# Patient Record
Sex: Male | Born: 1950 | Race: White | Hispanic: No | Marital: Married | Smoking: Current every day smoker
Health system: Southern US, Community
[De-identification: ages and names within clinical notes are randomized; demographics above are authoritative.]

## PROBLEM LIST (undated history)

## (undated) DIAGNOSIS — E785 Hyperlipidemia, unspecified: Secondary | ICD-10-CM

## (undated) DIAGNOSIS — F32A Depression, unspecified: Secondary | ICD-10-CM

## (undated) DIAGNOSIS — I10 Essential (primary) hypertension: Secondary | ICD-10-CM

## (undated) DIAGNOSIS — F329 Major depressive disorder, single episode, unspecified: Secondary | ICD-10-CM

## (undated) DIAGNOSIS — K219 Gastro-esophageal reflux disease without esophagitis: Secondary | ICD-10-CM

## (undated) HISTORY — DX: Major depressive disorder, single episode, unspecified: F32.9

## (undated) HISTORY — DX: Hyperlipidemia, unspecified: E78.5

## (undated) HISTORY — DX: Essential (primary) hypertension: I10

## (undated) HISTORY — DX: Gastro-esophageal reflux disease without esophagitis: K21.9

## (undated) HISTORY — DX: Depression, unspecified: F32.A

---
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
## 2005-04-21 ENCOUNTER — Ambulatory Visit: Payer: Self-pay | Admitting: Family Medicine

## 2006-06-27 IMAGING — CR DG FOOT COMPLETE 3+V*L*
1 series · 3 of 3 positions shown · non-contrast
Comparison: none

REASON FOR EXAM: xray lt foot pain
COMMENTS:

[Series 1: view not recorded · 0.17mm/px · 3 of 3 slices shown]
[im 1/3]
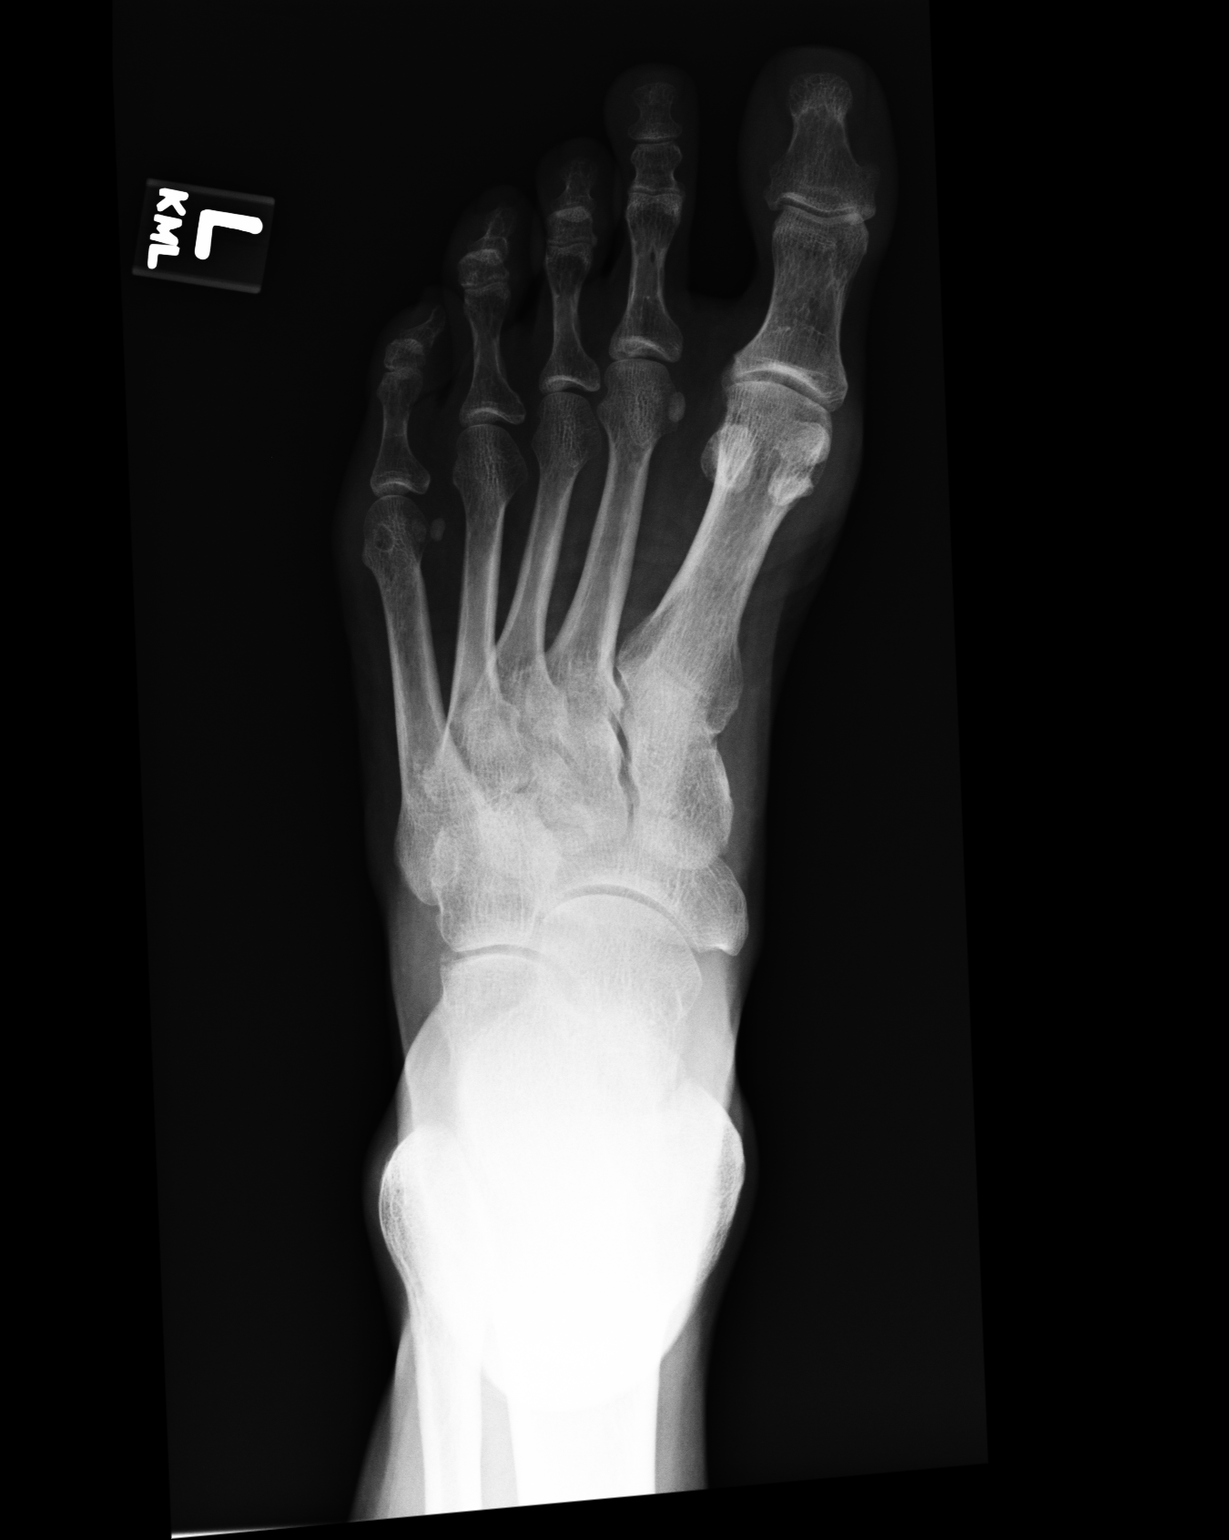
[im 2/3]
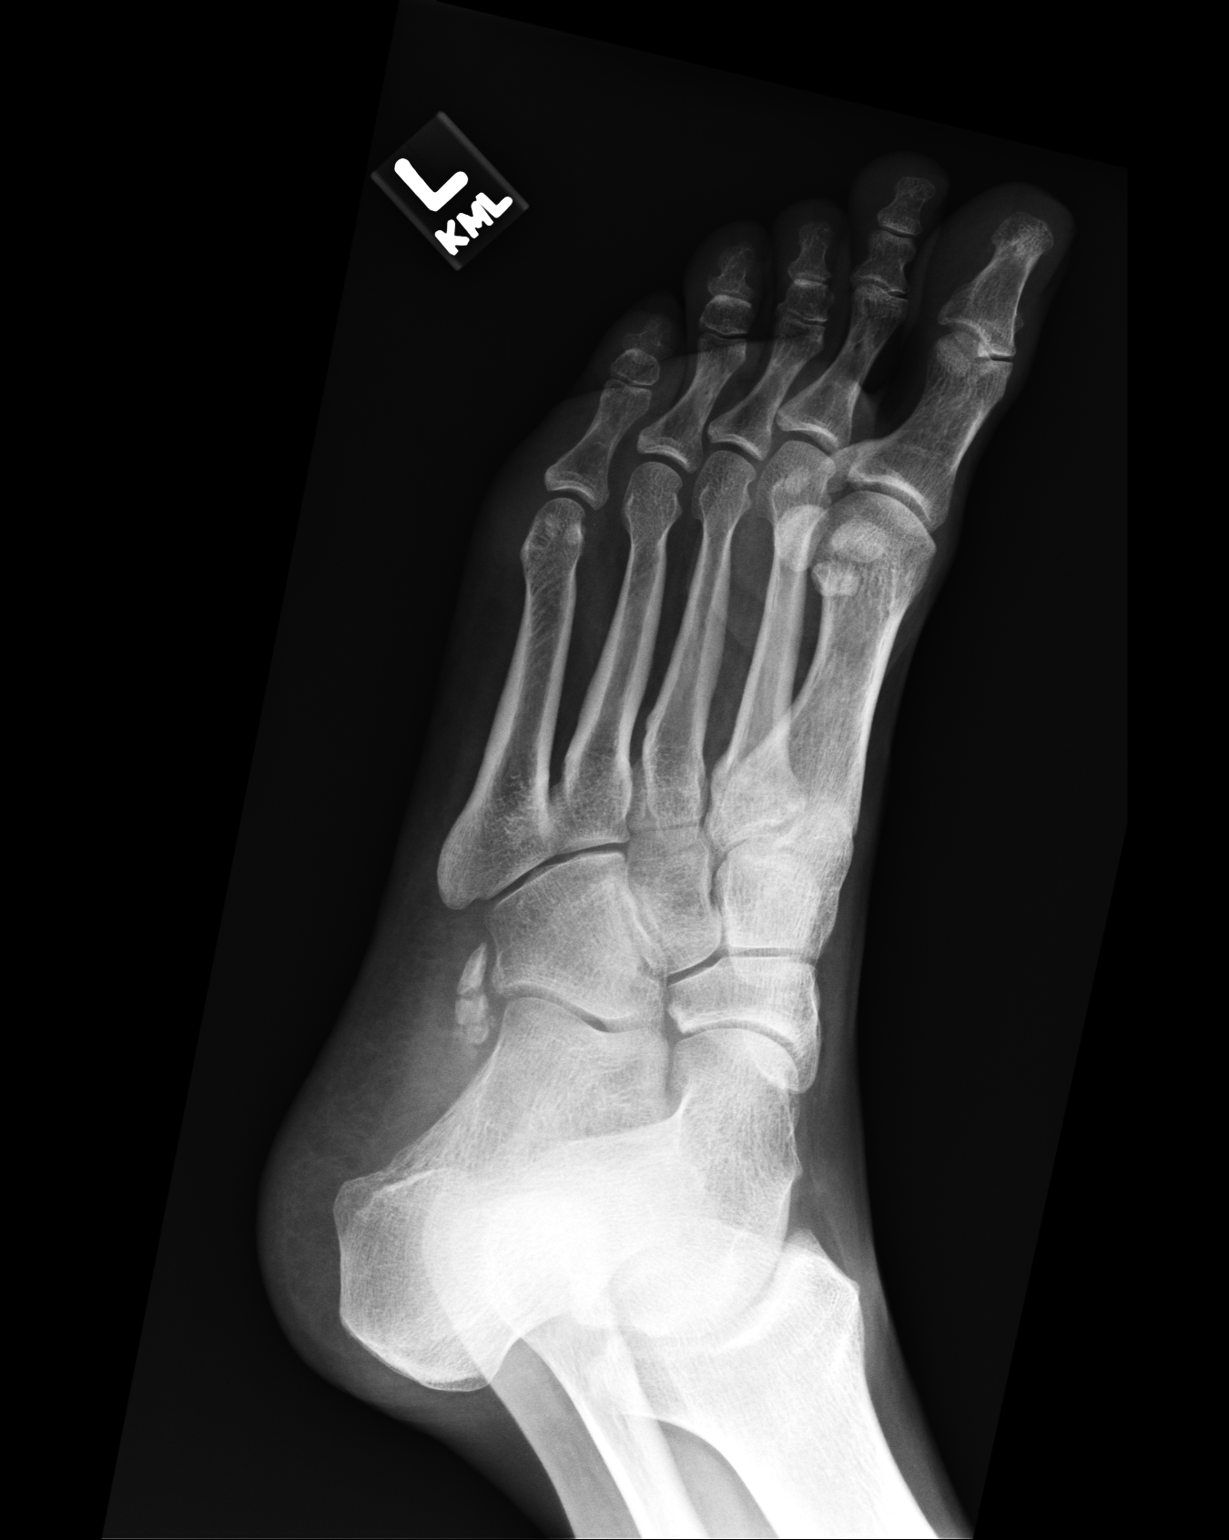
[im 3/3]
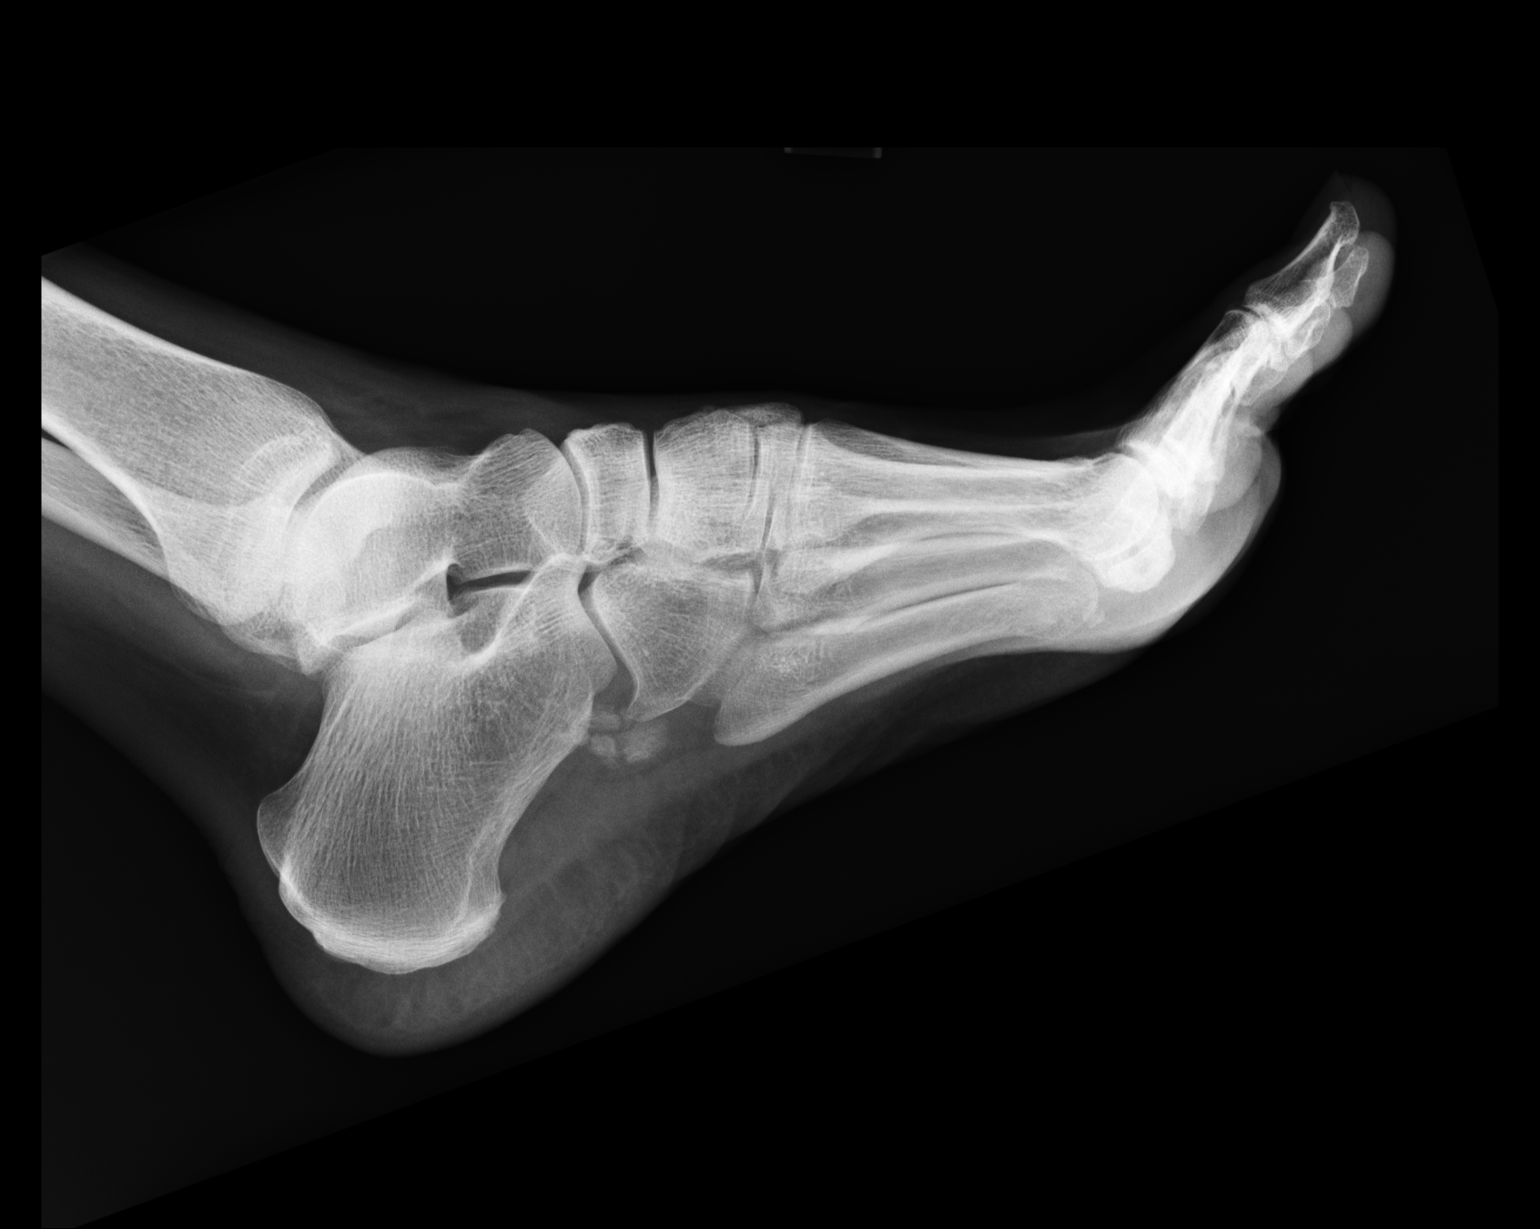

[3 of 3 positions shown; findings below may reference images not displayed]

PROCEDURE:     DXR - DXR FOOT LT COMP W/OBLIQUES  - April 21, 2005  [DATE]

RESULT:     Three views of the LEFT foot show no fracture, dislocation, or
other acute bony abnormality.  There are noted calcifications in the soft
tissues adjacent to the plantar aspect of the cuboid.  These apparently
represent sesamoid bones or possibly fragments of a sesamoid that has been
previously fractured.  No acute fracture is evident.  Incidental note is
made of a 3-4 mm cyst in the distal 5th metatarsal.
IMPRESSION: No acute changes are identified.

## 2013-10-21 LAB — HEPATIC FUNCTION PANEL
ALK PHOS: 83 U/L (ref 25–125)
ALT: 23 U/L (ref 10–40)
AST: 26 U/L (ref 14–40)
BILIRUBIN, TOTAL: 0.2 mg/dL

## 2013-10-21 LAB — CBC AND DIFFERENTIAL
HCT: 48 % (ref 41–53)
HEMOGLOBIN: 16.4 g/dL (ref 13.5–17.5)
Neutrophils Absolute: 6 /uL
PLATELETS: 306 10*3/uL (ref 150–399)
WBC: 8.7 10^3/mL

## 2013-10-21 LAB — BASIC METABOLIC PANEL
BUN: 19 mg/dL (ref 4–21)
CREATININE: 0.9 mg/dL (ref 0.6–1.3)
Glucose: 76 mg/dL
POTASSIUM: 4.2 mmol/L (ref 3.4–5.3)
SODIUM: 141 mmol/L (ref 137–147)

## 2013-10-21 LAB — LIPID PANEL
CHOLESTEROL: 205 mg/dL — AB (ref 0–200)
HDL: 54 mg/dL (ref 35–70)
LDL Cholesterol: 121 mg/dL
TRIGLYCERIDES: 150 mg/dL (ref 40–160)

## 2013-10-21 LAB — TSH: TSH: 0.69 u[IU]/mL (ref 0.41–5.90)

## 2013-10-21 LAB — PSA: PSA: 0.8

## 2014-04-21 ENCOUNTER — Ambulatory Visit: Payer: Self-pay | Admitting: Family Medicine

## 2015-05-18 ENCOUNTER — Ambulatory Visit (INDEPENDENT_AMBULATORY_CARE_PROVIDER_SITE_OTHER): Payer: PRIVATE HEALTH INSURANCE | Admitting: Family Medicine

## 2015-05-18 ENCOUNTER — Encounter: Payer: Self-pay | Admitting: Family Medicine

## 2015-05-18 VITALS — BP 128/70 | HR 84 | Temp 97.8°F | Resp 16 | Wt 132.0 lb

## 2015-05-18 DIAGNOSIS — R634 Abnormal weight loss: Secondary | ICD-10-CM | POA: Diagnosis not present

## 2015-05-18 DIAGNOSIS — E7800 Pure hypercholesterolemia, unspecified: Secondary | ICD-10-CM

## 2015-05-18 DIAGNOSIS — K219 Gastro-esophageal reflux disease without esophagitis: Secondary | ICD-10-CM | POA: Insufficient documentation

## 2015-05-18 DIAGNOSIS — G44029 Chronic cluster headache, not intractable: Secondary | ICD-10-CM | POA: Insufficient documentation

## 2015-05-18 DIAGNOSIS — G4700 Insomnia, unspecified: Secondary | ICD-10-CM | POA: Insufficient documentation

## 2015-05-18 DIAGNOSIS — F411 Generalized anxiety disorder: Secondary | ICD-10-CM | POA: Insufficient documentation

## 2015-05-18 DIAGNOSIS — F17200 Nicotine dependence, unspecified, uncomplicated: Secondary | ICD-10-CM | POA: Insufficient documentation

## 2015-05-18 DIAGNOSIS — E559 Vitamin D deficiency, unspecified: Secondary | ICD-10-CM | POA: Insufficient documentation

## 2015-05-18 DIAGNOSIS — J309 Allergic rhinitis, unspecified: Secondary | ICD-10-CM | POA: Insufficient documentation

## 2015-05-18 DIAGNOSIS — M5030 Other cervical disc degeneration, unspecified cervical region: Secondary | ICD-10-CM | POA: Insufficient documentation

## 2015-05-18 DIAGNOSIS — I10 Essential (primary) hypertension: Secondary | ICD-10-CM | POA: Diagnosis not present

## 2015-05-18 DIAGNOSIS — F330 Major depressive disorder, recurrent, mild: Secondary | ICD-10-CM | POA: Insufficient documentation

## 2015-05-18 DIAGNOSIS — M259 Joint disorder, unspecified: Secondary | ICD-10-CM | POA: Insufficient documentation

## 2015-05-18 DIAGNOSIS — G44009 Cluster headache syndrome, unspecified, not intractable: Secondary | ICD-10-CM | POA: Insufficient documentation

## 2015-05-18 MED ORDER — HYDROCODONE-ACETAMINOPHEN 5-325 MG PO TABS: 1.0000 | ORAL_TABLET | ORAL | Status: DC | PRN

## 2015-05-18 NOTE — Progress Notes (Signed)
Patient ID: Joseph Farrell, male   DOB: 03/12/1951, [age] y.o.   MRN: [id number]    Subjective:  HPI  Hypertension, follow-up:  BP Readings from Last 3 Encounters:  05/18/15 128/70    He was last seen for hypertension 5 months ago.  BP at that visit was 138/80. Management since that visit includes none. He reports good compliance with treatment. He is not having side effects. He is not exercising. He is not adherent to low salt diet.   Outside blood pressures are not being checked. Patient denies chest pain and chest pressure/discomfort.      Wt Readings from Last 3 Encounters:  05/18/15 132 lb (59.875 kg)    ------------------------------------------------------------------------   Pt reports that he has emotionally doing ok. He has some stressful times with his wife but he reports that he seems to be handling it well. He states " I just have to remind myself that there are people out there in much worse shape than I am in".     Prior to Admission medications   Medication Sig Start Date End Date Taking? Authorizing Provider  HYDROcodone-acetaminophen (NORCO/VICODIN) 5-325 MG tablet Take by mouth. 12/15/14  Yes Historical Provider, MD  omeprazole (PRILOSEC) 20 MG capsule Take 20 mg by mouth daily.   Yes Historical Provider, MD  tiZANidine (ZANAFLEX) 4 MG capsule Take by mouth. 12/15/14  Yes Historical Provider, MD  esomeprazole (NEXIUM) 40 MG capsule Take by mouth. 12/15/14   Historical Provider, MD    Patient Active Problem List   Diagnosis Date Noted  . Allergic rhinitis 05/18/2015  . Disorder of shoulder 05/18/2015  . Chronic cluster headache 05/18/2015  . Insomnia, persistent 05/18/2015  . Cluster headache syndrome 05/18/2015  . Degeneration of intervertebral disc of cervical region 05/18/2015  . Anxiety, generalized 05/18/2015  . Acid reflux 05/18/2015  . BP (high blood pressure) 05/18/2015  . Mild episode of recurrent major depressive disorder (HCC) 05/18/2015  .  Pure hypercholesterolemia 05/18/2015  . Compulsive tobacco user syndrome 05/18/2015  . Avitaminosis D 05/18/2015  . Decreased body weight 05/18/2015    Past Medical History  Diagnosis Date  . Hypertension   . Hyperlipidemia   . GERD (gastroesophageal reflux disease)   . Depression     Social History   Social History  . Marital Status: Married    Spouse Name: N/A  . Number of Children: N/A  . Years of Education: N/A   Occupational History  . Not on file.   Social History Main Topics  . Smoking status: Current Every Day Smoker  . Smokeless tobacco: Never Used  . Alcohol Use: No  . Drug Use: No  . Sexual Activity: Not on file   Other Topics Concern  . Not on file   Social History Narrative    Not on File  Review of Systems  Constitutional: Negative.   HENT: Negative.   Eyes: Negative.   Respiratory: Negative.   Cardiovascular: Negative.   Gastrointestinal: Negative.   Genitourinary: Negative.   Musculoskeletal: Negative.   Skin: Negative.   Neurological: Negative.   Endo/Heme/Allergies: Negative.   Psychiatric/Behavioral: Positive for depression. The patient is nervous/anxious.     Immunization History  Administered Date(s) Administered  . Zoster 04/09/2012   Objective:  BP 128/70 mmHg  Pulse 84  Temp(Src) 97.8 F (36.6 C) (Oral)  Resp 16  Wt 132 lb (59.875 kg)  Physical Exam  Constitutional: He is oriented to person, place, and time and well-developed, well-nourished, and in no distress.  Cachectic appearing white male in no acute distress.  HENT:  Head: Normocephalic and atraumatic.  Right Ear: External ear normal.  Left Ear: External ear normal.  Nose: Nose normal.  Poor dentition  Eyes: Conjunctivae are normal.  Neck: Neck supple.  Cardiovascular: Normal rate, regular rhythm and normal heart sounds.   Pulmonary/Chest: Effort normal and breath sounds normal.  Abdominal: Soft.  Neurological: He is alert and oriented to person, place, and  time.  Skin: Skin is warm and dry.  Psychiatric: Mood, memory, affect and judgment normal.    Lab Results  Component Value Date   WBC 8.7 10/21/2013   HGB 16.4 10/21/2013   HCT 48 10/21/2013   PLT 306 10/21/2013   CHOL 205* 10/21/2013   TRIG 150 10/21/2013   HDL 54 10/21/2013   LDLCALC 121 10/21/2013   TSH 0.69 10/21/2013   PSA 0.8 10/21/2013    CMP     Component Value Date/Time   NA 141 10/21/2013   K 4.2 10/21/2013   BUN 19 10/21/2013   CREATININE 0.9 10/21/2013   AST 26 10/21/2013   ALT 23 10/21/2013   ALKPHOS 83 10/21/2013    Assessment and Plan :  1. Essential hypertension - CBC with Differential/Platelet - TSH - Comprehensive metabolic panel  2. Pure hypercholesterolemia  - Comprehensive metabolic panel  3. Degeneration of intervertebral disc of cervical region Refill meds times 3. - HYDROcodone-acetaminophen (NORCO/VICODIN) 5-325 MG tablet; Take 1-2 tablets by mouth every 4 (four) hours as needed for moderate pain. 07/18/15  Dispense: 150 tablet; Refill: 0 - Comprehensive metabolic panel  4. Weight loss  - CBC with Differential/Platelet - TSH - Comprehensive metabolic panel 5.Malnutrition due to low caloric intake. Pt declines further w/u--says he feels fine. 6.MDD Continuing issue.  I have done the exam and reviewed the above chart and it is accurate to the best of my knowledge.  Julieanne Manson MD [facility]med  [facility] 05/18/2015 11:54 AM

## 2015-06-27 IMAGING — CR CERVICAL SPINE - COMPLETE 4+ VIEW
1 series · 6 of 6 positions shown · non-contrast
Comparison: None.

CLINICAL DATA: Neck pain, right shoulder pain

EXAM:
CERVICAL SPINE  4+ VIEWS

[Series 1: kdxr c-spine complete · 0.14mm/px · 6 of 6 slices shown]
[im 1/6]
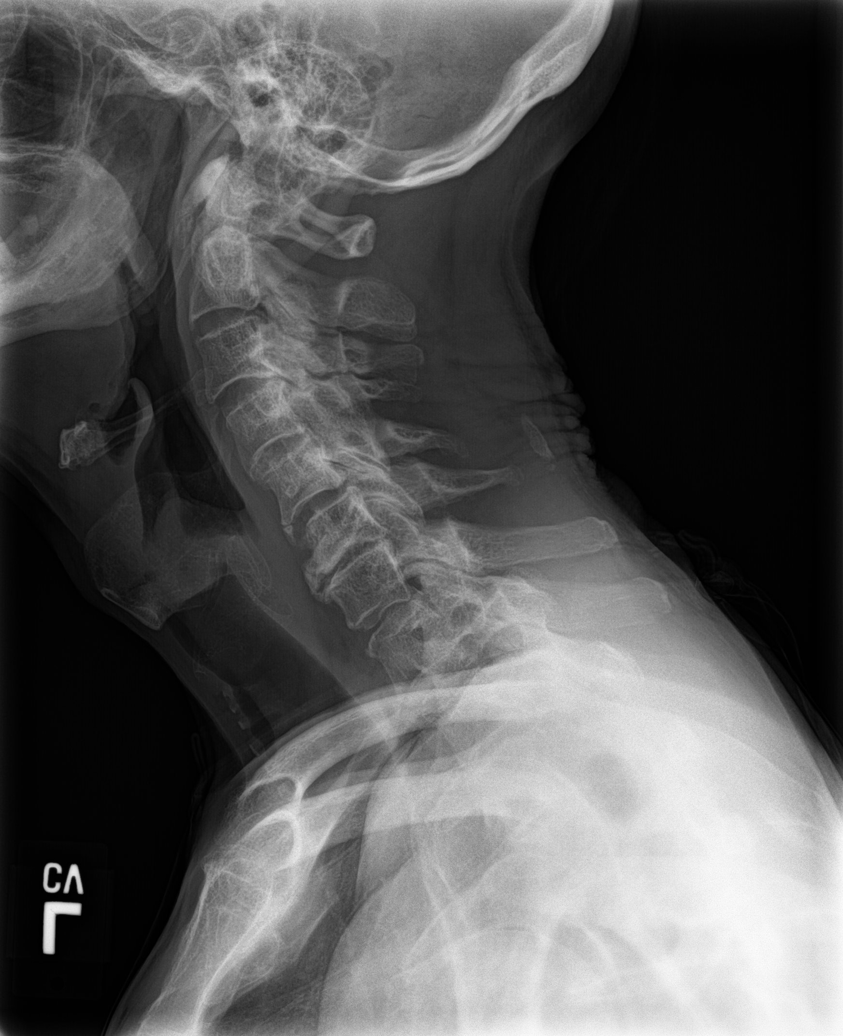
[im 2/6]
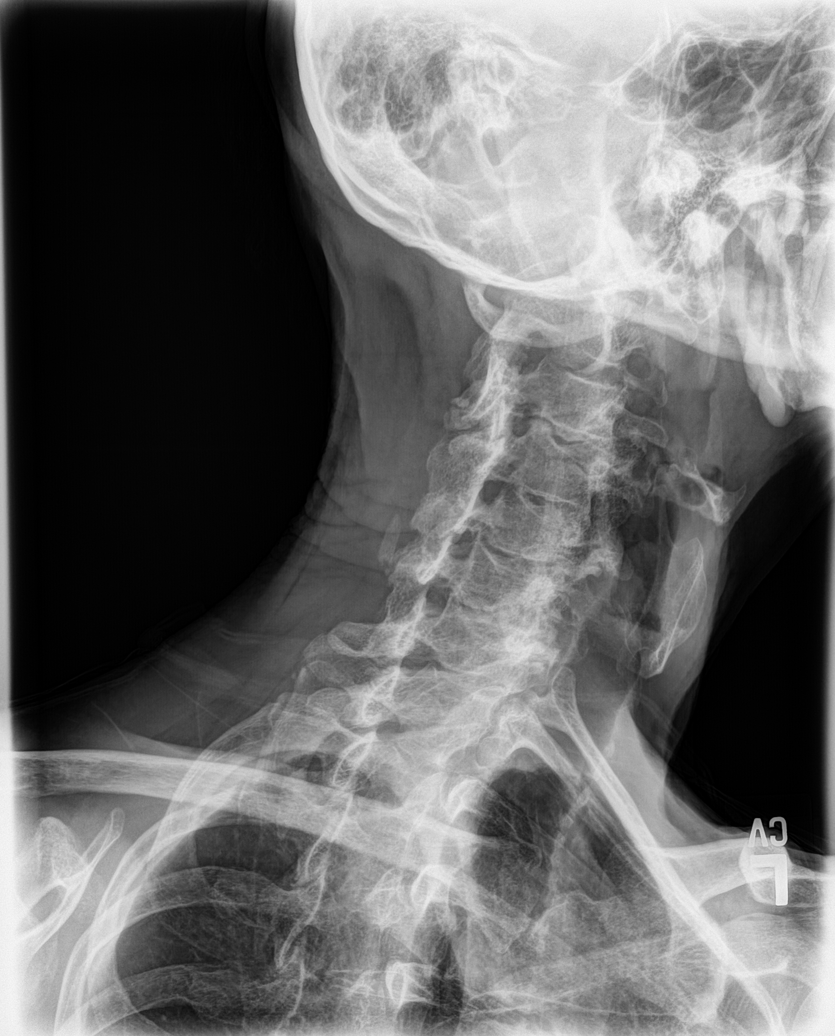
[im 3/6]
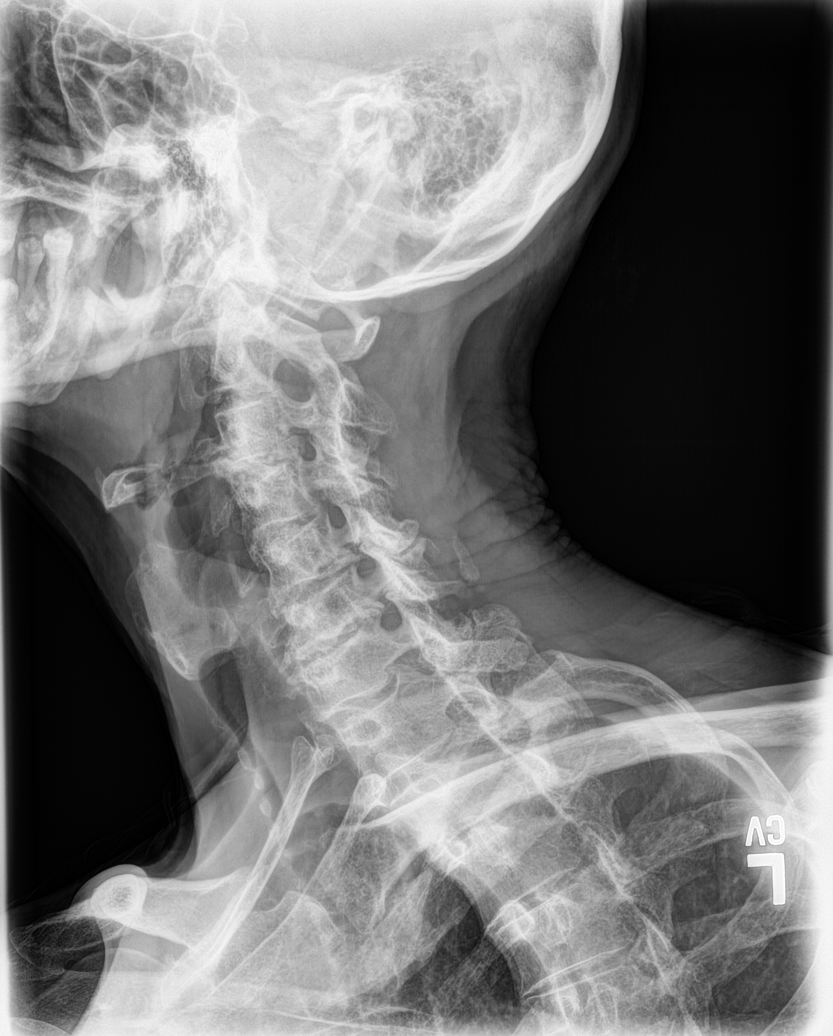
[im 4/6]
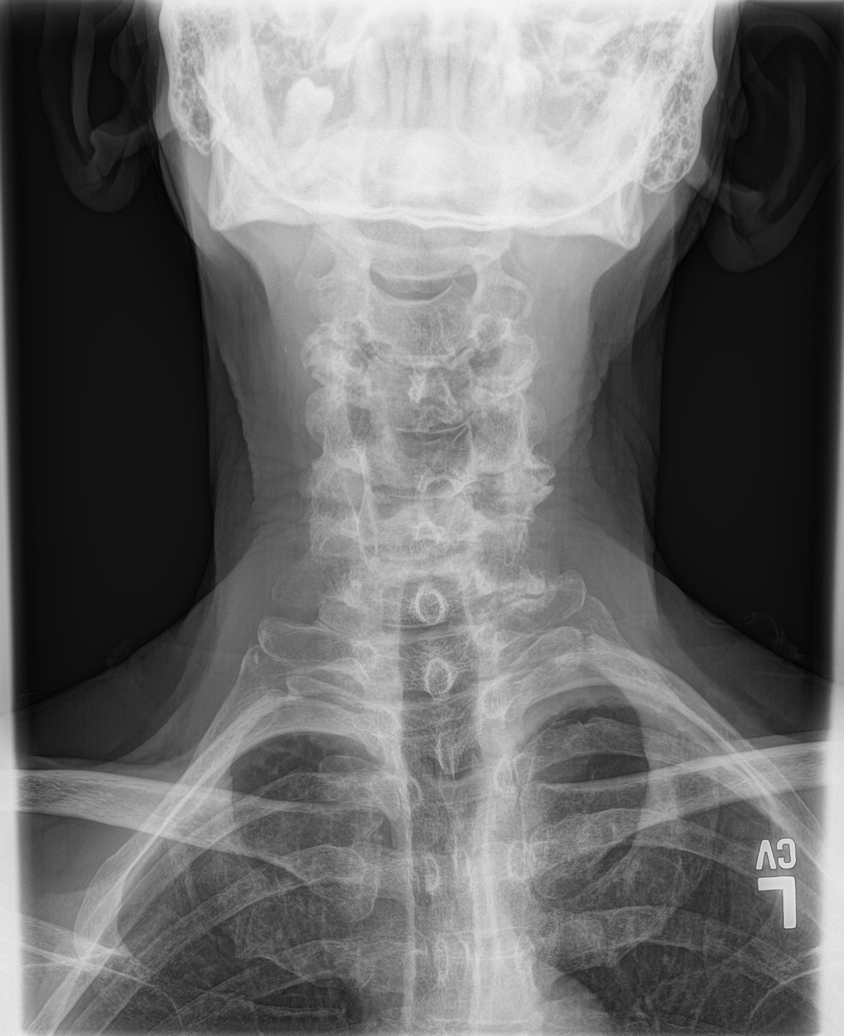
[im 5/6]
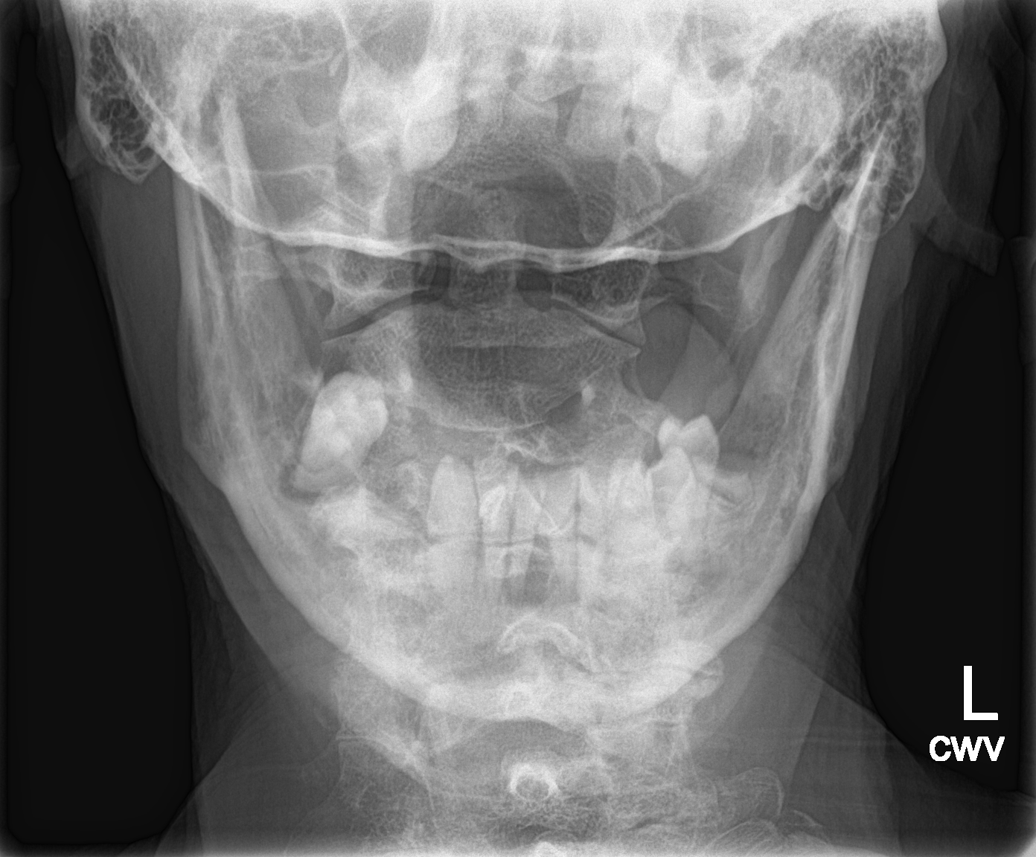
[im 6/6]
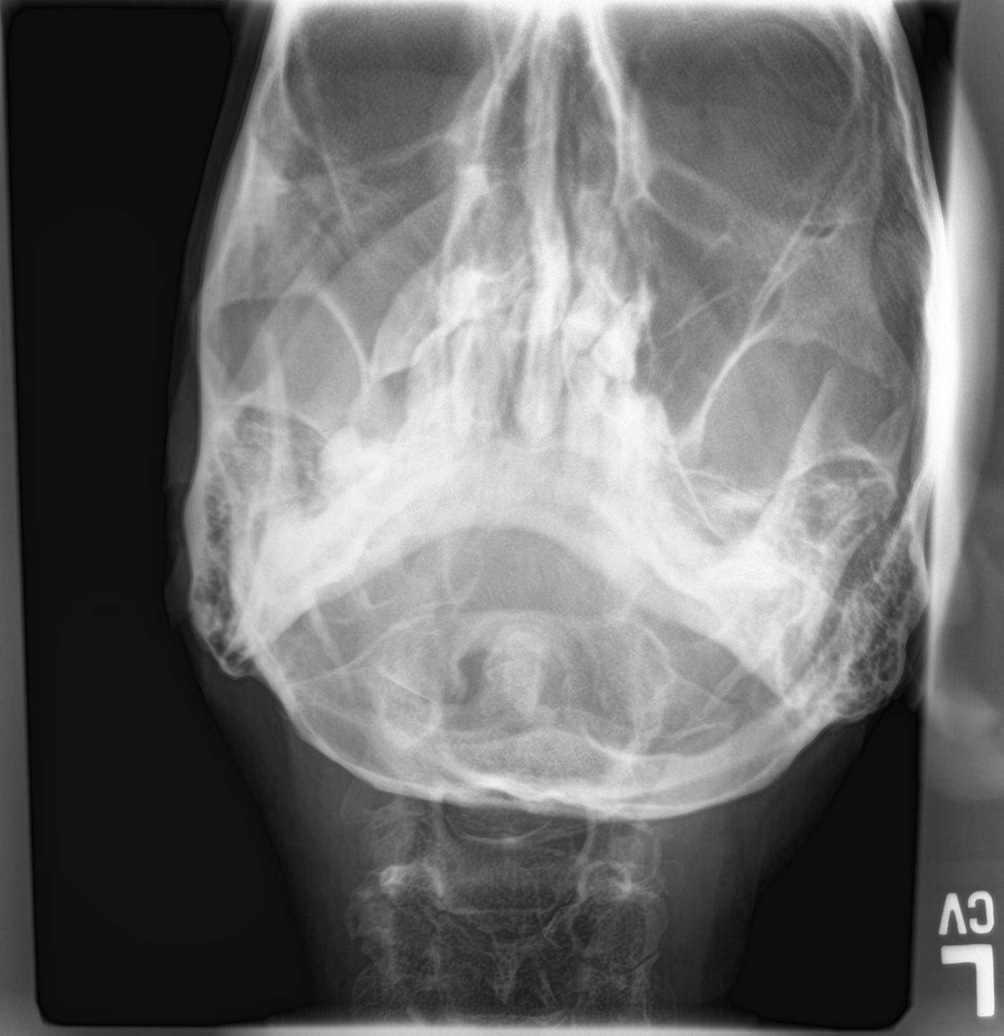

[6 of 6 positions shown; findings below may reference images not displayed]

FINDINGS: Six views of cervical spine submitted. No acute fracture or
subluxation. Mild disc space flattening with anterior spurring at
C4-C5 and C5-C6 level. Moderate disc space flattening with anterior
spurring and minimal posterior spurring at C6-C7 level. Mild disc
space flattening at C7-T1 level. No prevertebral soft tissue
swelling. Cervical airway is patent. Mild degenerative changes C1-C2
articulation.
IMPRESSION: No acute fracture or subluxation. Degenerative changes as described
above

## 2015-06-27 IMAGING — CR DG SHOULDER 3+V*R*
2 series · 3 of 3 positions shown · non-contrast
Comparison: None.

CLINICAL DATA: Neck pain, right shoulder pain, no known injury

EXAM:
DG SHOULDER 3+ VIEWS RIGHT

[Series 1: kdxr shoulder right complete · 0.14mm/px · 2 of 2 slices shown (1 of 2)]
[im 1/2]
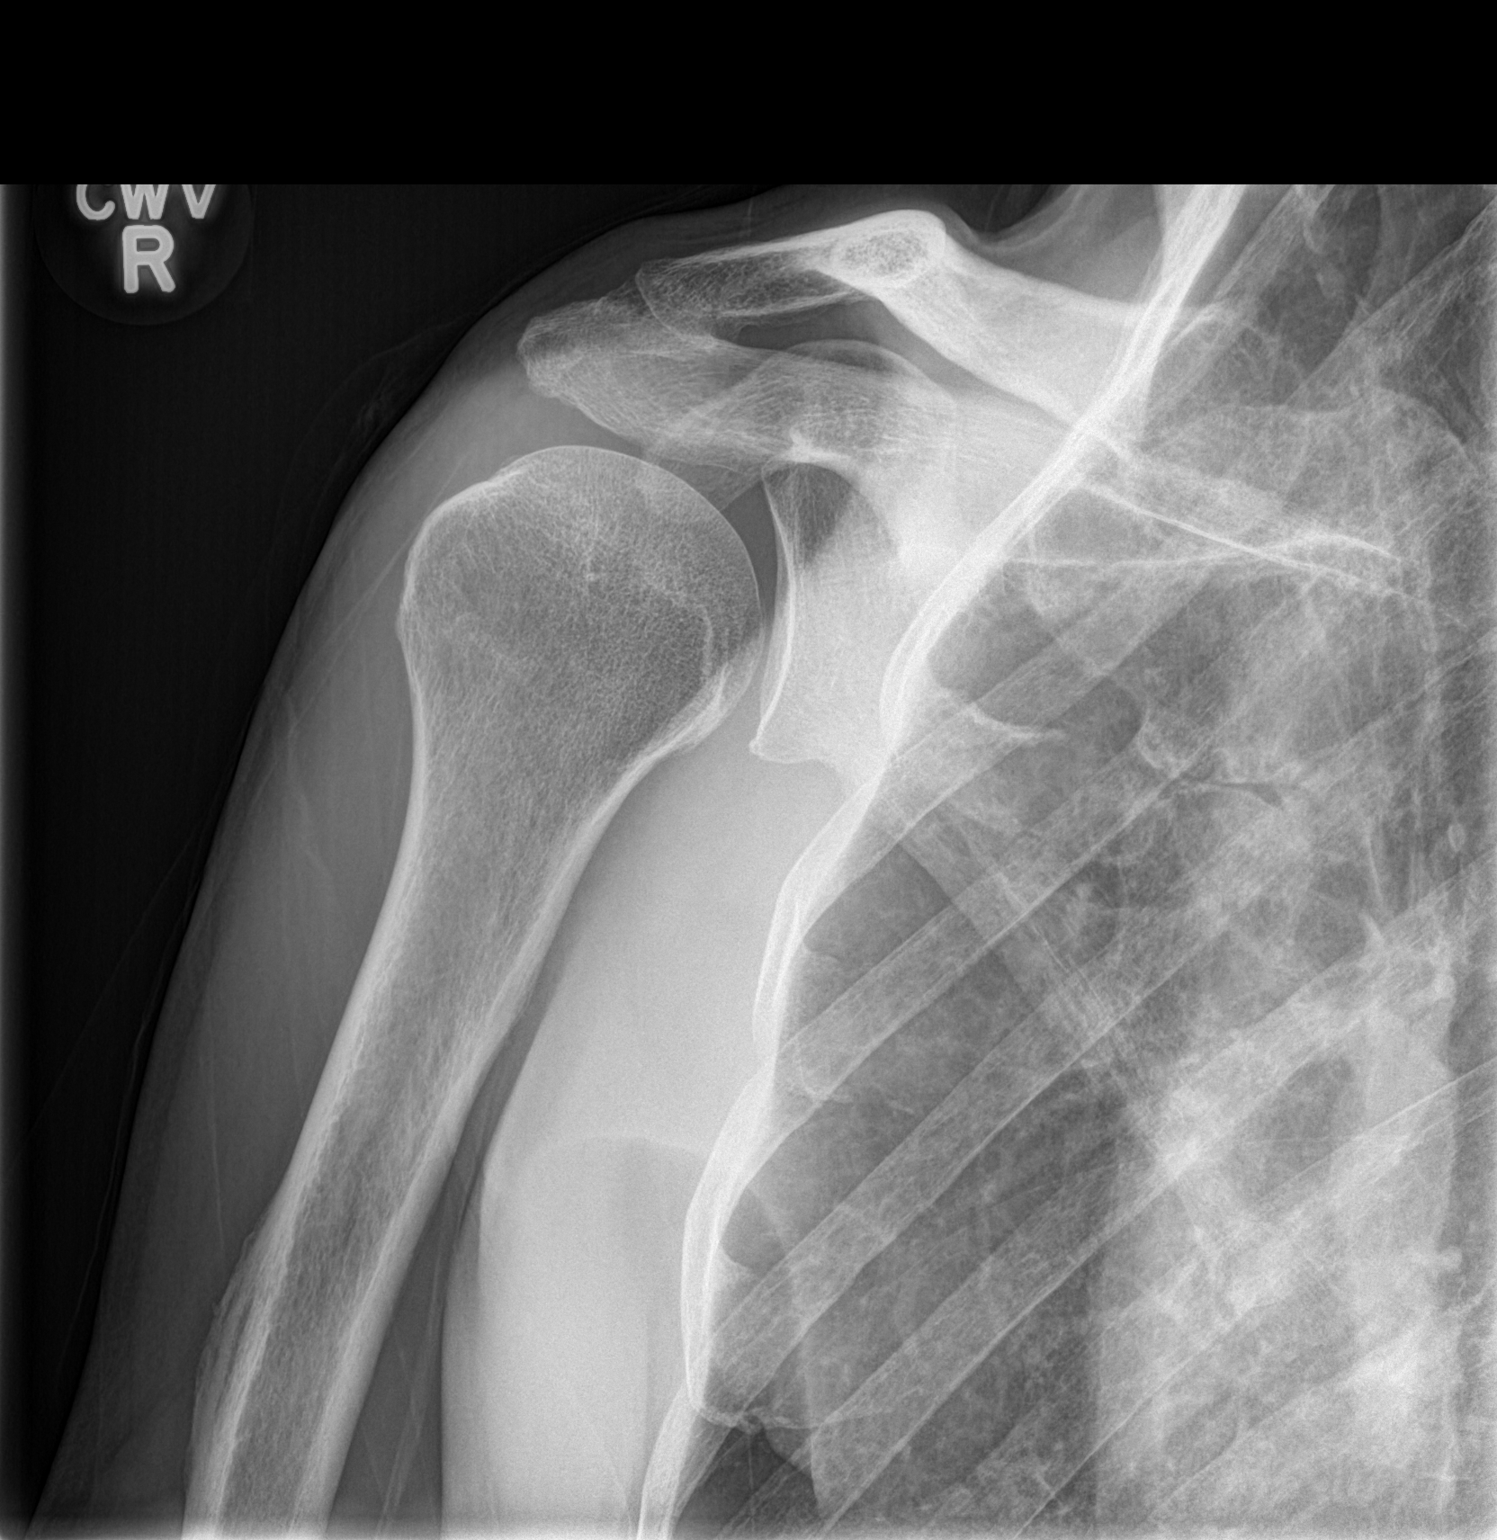
[im 2/2]
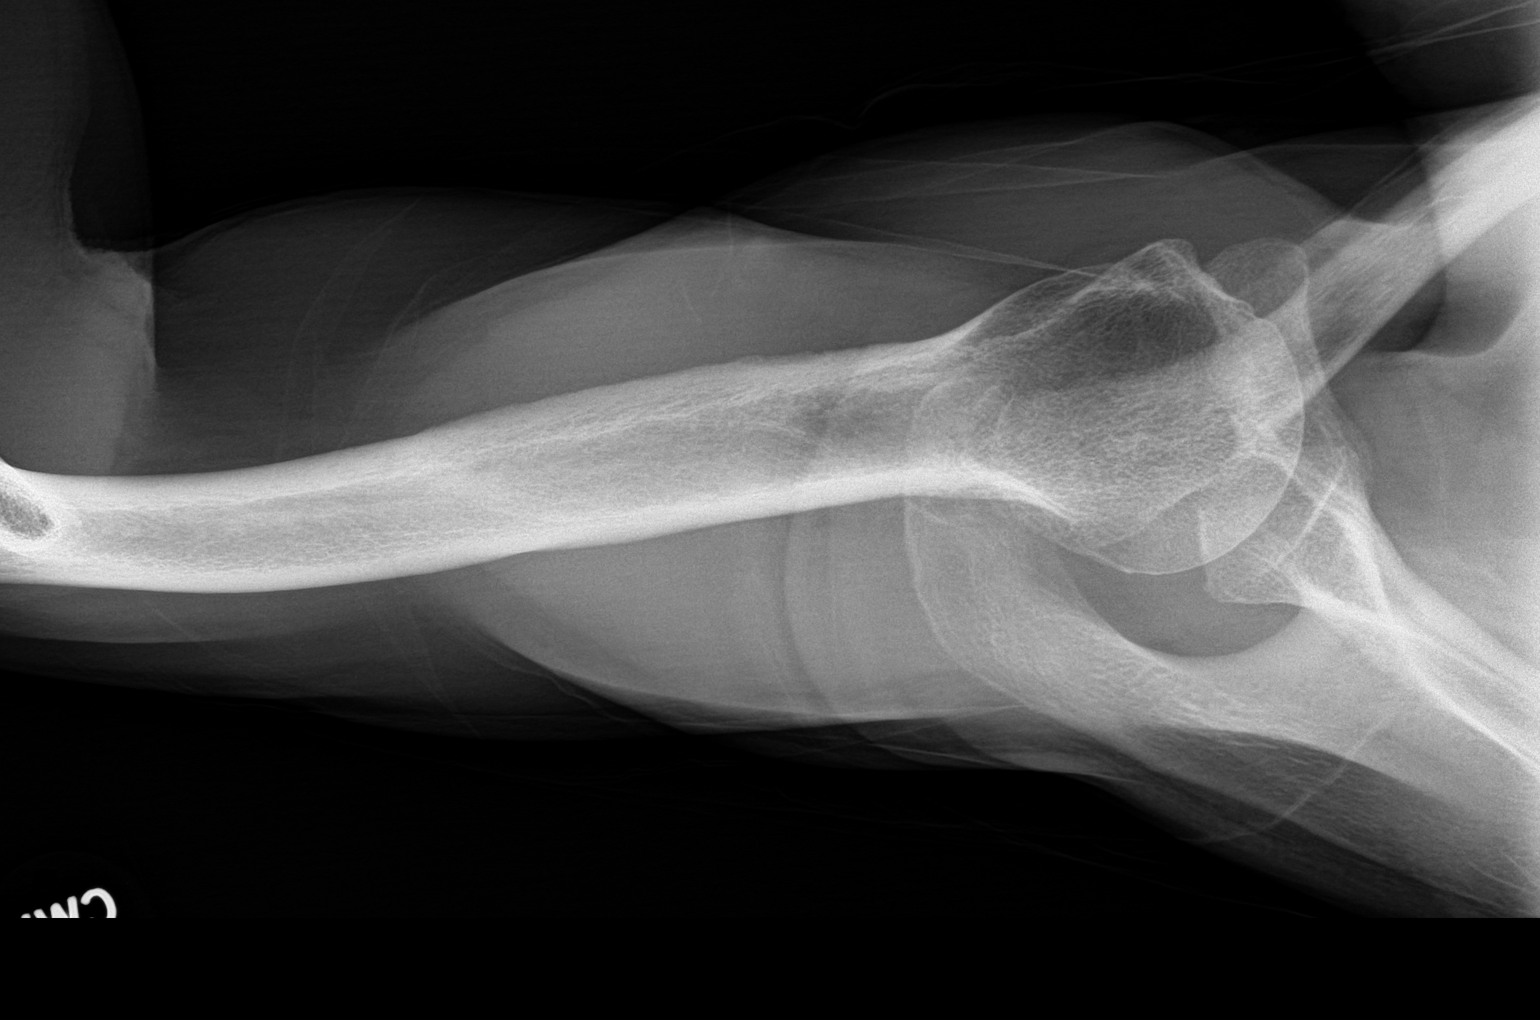

[kdxr shoulder right complete (2 of 2)]
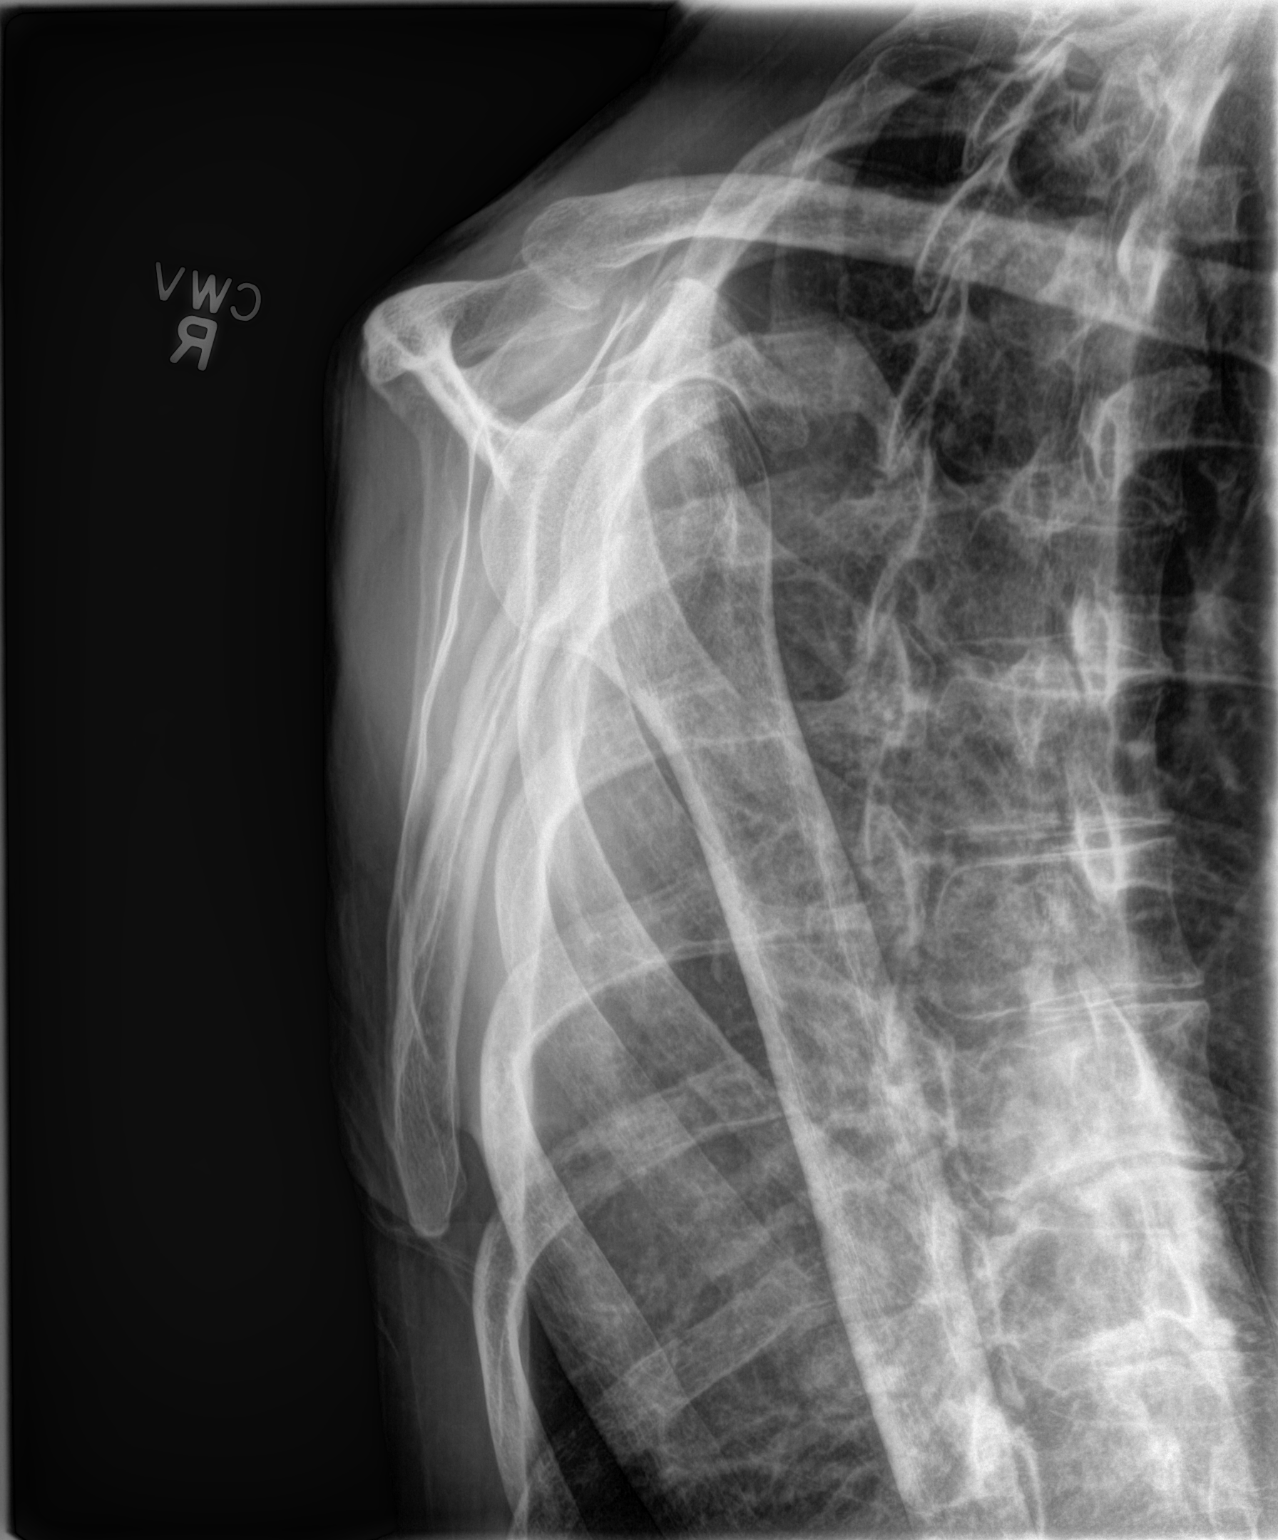

[3 of 3 positions shown; findings below may reference images not displayed]

FINDINGS: Three views of the right shoulder submitted. No acute fracture or
subluxation. Mild degenerative changes right AC joint.
IMPRESSION: No acute fracture or subluxation. Mild degenerative changes right AC
joint.

## 2015-08-20 ENCOUNTER — Other Ambulatory Visit: Payer: Self-pay

## 2015-11-16 ENCOUNTER — Encounter: Payer: Self-pay | Admitting: Family Medicine

## 2015-11-16 ENCOUNTER — Other Ambulatory Visit: Payer: Self-pay | Admitting: Family Medicine

## 2015-11-16 ENCOUNTER — Ambulatory Visit (INDEPENDENT_AMBULATORY_CARE_PROVIDER_SITE_OTHER): Payer: BLUE CROSS/BLUE SHIELD | Admitting: Family Medicine

## 2015-11-16 VITALS — BP 114/62 | HR 98 | Temp 98.4°F | Resp 16 | Ht 70.0 in | Wt 136.0 lb

## 2015-11-16 DIAGNOSIS — M5030 Other cervical disc degeneration, unspecified cervical region: Secondary | ICD-10-CM | POA: Diagnosis not present

## 2015-11-16 DIAGNOSIS — F330 Major depressive disorder, recurrent, mild: Secondary | ICD-10-CM | POA: Diagnosis not present

## 2015-11-16 DIAGNOSIS — F411 Generalized anxiety disorder: Secondary | ICD-10-CM

## 2015-11-16 DIAGNOSIS — R634 Abnormal weight loss: Secondary | ICD-10-CM

## 2015-11-16 DIAGNOSIS — I10 Essential (primary) hypertension: Secondary | ICD-10-CM

## 2015-11-16 MED ORDER — HYDROCODONE-ACETAMINOPHEN 5-325 MG PO TABS: 1.0000 | ORAL_TABLET | ORAL | Status: DC | PRN

## 2015-11-16 MED ORDER — SERTRALINE HCL 50 MG PO TABS: 50.0000 mg | ORAL_TABLET | Freq: Every day | ORAL | Status: DC

## 2015-11-16 NOTE — Progress Notes (Signed)
Patient ID: Joseph Farrell, male   DOB: [birth year], 65 y.o.   MRN: [id number]    Subjective:  HPI  Hypertension, follow-up:  BP Readings from Last 3 Encounters:  11/16/15 114/62  05/18/15 128/70    He was last seen for hypertension 6 months ago.  BP at that visit was 128/70. Management since that visit includes none. He reports good compliance with treatment. He is not having side effects.  He is not exercising. He is adherent to low salt diet.   Outside blood pressures are not being checked. He is experiencing none.  Patient denies chest pain, chest pressure/discomfort, claudication, dyspnea, exertional chest pressure/discomfort, fatigue, irregular heart beat, lower extremity edema, near-syncope, orthopnea, palpitations and paroxysmal nocturnal dyspnea.   Cardiovascular risk factors include advanced age (older than 6855 for men, 5865 for women), dyslipidemia, hypertension, male gender and smoking/ tobacco exposure.   Wt Readings from Last 3 Encounters:  11/16/15 136 lb (61.689 kg)  05/18/15 132 lb (59.875 kg)   ------------------------------------------------------------------------  Arthritis- He reports that is getting worse, more joints are starting to hurt. He is still taking Hydrocodone/APAP and is still working well. He will need refills today.   Depression-  Pt reports that he would like to try something for depression. He is having trouble getting up and doing things that he does for fun. He has taken Sertraline most recently, but he only took that for about 2 weeks and did not give it a chance and would like to give it another try.    Prior to Admission medications   Medication Sig Start Date End Date Taking? Authorizing Provider  HYDROcodone-acetaminophen (NORCO/VICODIN) 5-325 MG tablet Take 1-2 tablets by mouth every 4 (four) hours as needed for moderate pain. 07/18/15 05/18/15  Yes Richard L Wendelyn BreslowGilbert Jr., MD  omeprazole (PRILOSEC) 20 MG capsule Take 20 mg by mouth  daily.   Yes Historical Provider, MD  tiZANidine (ZANAFLEX) 4 MG capsule Take by mouth. 12/15/14  Yes Historical Provider, MD  esomeprazole (NEXIUM) 40 MG capsule Take by mouth. Reported on 11/16/2015 12/15/14   Historical Provider, MD    Patient Active Problem List   Diagnosis Date Noted  . Allergic rhinitis 05/18/2015  . Disorder of shoulder 05/18/2015  . Chronic cluster headache 05/18/2015  . Insomnia, persistent 05/18/2015  . Cluster headache syndrome 05/18/2015  . Degeneration of intervertebral disc of cervical region 05/18/2015  . Anxiety, generalized 05/18/2015  . Acid reflux 05/18/2015  . BP (high blood pressure) 05/18/2015  . Mild episode of recurrent major depressive disorder (HCC) 05/18/2015  . Pure hypercholesterolemia 05/18/2015  . Compulsive tobacco user syndrome 05/18/2015  . Avitaminosis D 05/18/2015  . Decreased body weight 05/18/2015    Past Medical History  Diagnosis Date  . Hypertension   . Hyperlipidemia   . GERD (gastroesophageal reflux disease)   . Depression     Social History   Social History  . Marital Status: Married    Spouse Name: N/A  . Number of Children: N/A  . Years of Education: N/A   Occupational History  . Not on file.   Social History Main Topics  . Smoking status: Current Every Day Smoker -- 1.50 packs/day  . Smokeless tobacco: Never Used  . Alcohol Use: No  . Drug Use: No  . Sexual Activity: Not on file   Other Topics Concern  . Not on file   Social History Narrative    Not on File  Review of Systems  Constitutional: Negative.   HENT:  Negative.   Eyes: Negative.   Respiratory: Negative.   Cardiovascular: Negative.   Gastrointestinal: Negative.   Genitourinary: Negative.   Musculoskeletal: Positive for joint pain.  Skin: Negative.   Neurological: Negative.   Endo/Heme/Allergies: Negative.   Psychiatric/Behavioral: Positive for depression.    Immunization History  Administered Date(s) Administered  . Zoster  04/09/2012   Objective:  BP 114/62 mmHg  Pulse 98  Temp(Src) 98.4 F (36.9 C) (Oral)  Resp 16  Ht  (1.778 m)  Wt 136 lb (61.689 kg)  BMI 19.51 kg/m2  Physical Exam  Constitutional: He is oriented to person, place, and time and well-developed, well-nourished, and in no distress.  HENT:  Head: Normocephalic and atraumatic.  Right Ear: External ear normal.  Left Ear: External ear normal.  Nose: Nose normal.  Eyes: Conjunctivae and EOM are normal. Pupils are equal, round, and reactive to light.  Neck: Normal range of motion. Neck supple.  Cardiovascular: Normal rate, regular rhythm, normal heart sounds and intact distal pulses.   Pulmonary/Chest: Effort normal and breath sounds normal.  Abdominal: Soft. Bowel sounds are normal.  Musculoskeletal: Normal range of motion.  Neurological: He is alert and oriented to person, place, and time. He has normal reflexes. Gait normal. GCS score is 15.  Skin: Skin is warm and dry.  Psychiatric: Mood, memory, affect and judgment normal.    Lab Results  Component Value Date   WBC 8.7 10/21/2013   HGB 16.4 10/21/2013   HCT 48 10/21/2013   PLT 306 10/21/2013   CHOL 205* 10/21/2013   TRIG 150 10/21/2013   HDL 54 10/21/2013   LDLCALC 121 10/21/2013   TSH 0.69 10/21/2013   PSA 0.8 10/21/2013    CMP     Component Value Date/Time   NA 141 10/21/2013   K 4.2 10/21/2013   BUN 19 10/21/2013   CREATININE 0.9 10/21/2013   AST 26 10/21/2013   ALT 23 10/21/2013   ALKPHOS 83 10/21/2013    Assessment and Plan :  1. Mild episode of recurrent major depressive disorder (HCC) Patient now fully retired but very frustrated as his wife is in poor health in requires him to be attentive almost around-the-clock. - sertraline (ZOLOFT) 50 MG tablet; Take 1 tablet (50 mg total) by mouth daily.  Dispense: 30 tablet; Refill: 12 More than 50% of this visit is spent in counseling regarding these issues. 2. Essential hypertension   3. Degeneration of  intervertebral disc of cervical region Refills times 3 - HYDROcodone-acetaminophen (NORCO/VICODIN) 5-325 MG tablet; Take 1-2 tablets by mouth every 4 (four) hours as needed for moderate pain.  Dispense: 150 tablet; Refill: 0 4. Anxiety, generalized 5. Tobacco abuse Patient advised to quit 6. Long history of cluster headaches  Patient was seen and examined by Dr. Julieanne Manson, and noted scribed by Dimas Chyle, CMA  Julieanne Manson MD [facility] 11/16/2015 2:31 PM

## 2015-11-17 LAB — CBC WITH DIFFERENTIAL/PLATELET
BASOS: 1 %
Basophils Absolute: 0.1 10*3/uL (ref 0.0–0.2)
EOS (ABSOLUTE): 0.2 10*3/uL (ref 0.0–0.4)
Eos: 2 %
Hematocrit: 46.4 % (ref 37.5–51.0)
Hemoglobin: 15.6 g/dL (ref 12.6–17.7)
IMMATURE GRANULOCYTES: 0 %
Immature Grans (Abs): 0 10*3/uL (ref 0.0–0.1)
Lymphocytes Absolute: 2.3 10*3/uL (ref 0.7–3.1)
Lymphs: 25 %
MCH: 29.5 pg (ref 26.6–33.0)
MCHC: 33.6 g/dL (ref 31.5–35.7)
MCV: 88 fL (ref 79–97)
MONOS ABS: 0.5 10*3/uL (ref 0.1–0.9)
Monocytes: 6 %
NEUTROS ABS: 5.9 10*3/uL (ref 1.4–7.0)
NEUTROS PCT: 66 %
PLATELETS: 314 10*3/uL (ref 150–379)
RBC: 5.28 x10E6/uL (ref 4.14–5.80)
RDW: 14.9 % (ref 12.3–15.4)
WBC: 9 10*3/uL (ref 3.4–10.8)

## 2015-11-17 LAB — COMPREHENSIVE METABOLIC PANEL
A/G RATIO: 1.7 (ref 1.2–2.2)
ALT: 19 IU/L (ref 0–44)
AST: 31 IU/L (ref 0–40)
Albumin: 4.7 g/dL (ref 3.6–4.8)
Alkaline Phosphatase: 79 IU/L (ref 39–117)
BUN/Creatinine Ratio: 20 (ref 10–24)
BUN: 17 mg/dL (ref 8–27)
CHLORIDE: 102 mmol/L (ref 96–106)
CO2: 20 mmol/L (ref 18–29)
Calcium: 9.7 mg/dL (ref 8.6–10.2)
Creatinine, Ser: 0.87 mg/dL (ref 0.76–1.27)
GFR calc Af Amer: 105 mL/min/{1.73_m2} (ref >59)
GFR calc non Af Amer: 91 mL/min/{1.73_m2} (ref >59)
Globulin, Total: 2.7 g/dL (ref 1.5–4.5)
Glucose: 80 mg/dL (ref 65–99)
POTASSIUM: 4.5 mmol/L (ref 3.5–5.2)
Sodium: 140 mmol/L (ref 134–144)
Total Protein: 7.4 g/dL (ref 6.0–8.5)

## 2015-11-17 LAB — TSH: TSH: 0.76 u[IU]/mL (ref 0.450–4.500)

## 2015-11-23 ENCOUNTER — Telehealth: Payer: Self-pay

## 2015-11-23 NOTE — Telephone Encounter (Signed)
LMTCB ED 

## 2015-11-23 NOTE — Telephone Encounter (Signed)
Advised  ED 

## 2015-11-23 NOTE — Telephone Encounter (Signed)
-----   Message from Maple Hudsonichard L Gilbert Jr., MD sent at 11/20/2015  5:01 AM EDT ----- Labs stable.

## 2016-01-18 ENCOUNTER — Ambulatory Visit: Payer: BLUE CROSS/BLUE SHIELD | Admitting: Family Medicine

## 2016-01-19 ENCOUNTER — Ambulatory Visit (INDEPENDENT_AMBULATORY_CARE_PROVIDER_SITE_OTHER): Payer: BLUE CROSS/BLUE SHIELD | Admitting: Family Medicine

## 2016-01-19 VITALS — BP 144/72 | HR 100 | Temp 97.9°F | Resp 16 | Wt 135.0 lb

## 2016-01-19 DIAGNOSIS — F330 Major depressive disorder, recurrent, mild: Secondary | ICD-10-CM

## 2016-01-19 DIAGNOSIS — F411 Generalized anxiety disorder: Secondary | ICD-10-CM | POA: Diagnosis not present

## 2016-01-19 DIAGNOSIS — I10 Essential (primary) hypertension: Secondary | ICD-10-CM | POA: Diagnosis not present

## 2016-01-19 DIAGNOSIS — M5030 Other cervical disc degeneration, unspecified cervical region: Secondary | ICD-10-CM | POA: Diagnosis not present

## 2016-01-19 MED ORDER — HYDROCODONE-ACETAMINOPHEN 5-325 MG PO TABS: 1.0000 | ORAL_TABLET | ORAL | Status: DC | PRN

## 2016-01-19 MED ORDER — SERTRALINE HCL 100 MG PO TABS: 100.0000 mg | ORAL_TABLET | Freq: Every day | ORAL | Status: DC

## 2016-01-19 NOTE — Progress Notes (Signed)
Patient ID: Joseph Farrell, male   DOB: 02-14-1951, 65 y.o.   MRN: [id number]    Subjective:  HPI  Patient is here for 2 months follow up.  Depression: patient was started on Zoloft at 50 mg. Patient states he feels better about 20 to 30%. Tolerating medication well so far.  Patient continues to smoke and has no desire to quit smoking  Prior to Admission medications   Medication Sig Start Date End Date Taking? Authorizing Provider  esomeprazole (NEXIUM) 40 MG capsule Take by mouth. Reported on 11/16/2015 12/15/14  Yes Historical Provider, MD  HYDROcodone-acetaminophen (NORCO/VICODIN) 5-325 MG tablet Take 1-2 tablets by mouth every 4 (four) hours as needed for moderate pain. 11/16/15  Yes Richard Hulen Shouts., MD  omeprazole (PRILOSEC) 20 MG capsule Take 20 mg by mouth daily.   Yes Historical Provider, MD  sertraline (ZOLOFT) 50 MG tablet Take 1 tablet (50 mg total) by mouth daily. 11/16/15  Yes Richard Hulen Shouts., MD  tiZANidine (ZANAFLEX) 4 MG capsule Take by mouth. 12/15/14  Yes Historical Provider, MD    Patient Active Problem List   Diagnosis Date Noted  . Allergic rhinitis 05/18/2015  . Disorder of shoulder 05/18/2015  . Chronic cluster headache 05/18/2015  . Insomnia, persistent 05/18/2015  . Cluster headache syndrome 05/18/2015  . Degeneration of intervertebral disc of cervical region 05/18/2015  . Anxiety, generalized 05/18/2015  . Acid reflux 05/18/2015  . BP (high blood pressure) 05/18/2015  . Mild episode of recurrent major depressive disorder (HCC) 05/18/2015  . Pure hypercholesterolemia 05/18/2015  . Compulsive tobacco user syndrome 05/18/2015  . Avitaminosis D 05/18/2015  . Decreased body weight 05/18/2015    Past Medical History  Diagnosis Date  . Hypertension   . Hyperlipidemia   . GERD (gastroesophageal reflux disease)   . Depression     Social History   Social History  . Marital Status: Married    Spouse Name: N/A  . Number of Children: N/A  .  Years of Education: N/A   Occupational History  . Not on file.   Social History Main Topics  . Smoking status: Current Every Day Smoker -- 1.50 packs/day  . Smokeless tobacco: Never Used  . Alcohol Use: No  . Drug Use: No  . Sexual Activity: Not on file   Other Topics Concern  . Not on file   Social History Narrative    No Known Allergies  Review of Systems  Constitutional: Negative.   Respiratory: Negative.   Cardiovascular: Negative.   Gastrointestinal: Positive for abdominal pain.  Neurological: Positive for headaches (off and on).  Psychiatric/Behavioral: Positive for depression.    Immunization History  Administered Date(s) Administered  . Zoster 04/09/2012   Objective:  BP 144/72 mmHg  Pulse 100  Temp(Src) 97.9 F (36.6 C)  Resp 16  Wt 135 lb (61.236 kg)  Physical Exam  Constitutional: He is oriented to person, place, and time and well-developed, well-nourished, and in no distress.  HENT:  Head: Normocephalic and atraumatic.  Eyes: Conjunctivae are normal. Pupils are equal, round, and reactive to light.  Neck: Normal range of motion. Neck supple.  Cardiovascular: Normal rate, regular rhythm, normal heart sounds and intact distal pulses.   No murmur heard. Pulmonary/Chest: Effort normal and breath sounds normal. No respiratory distress. He has no wheezes.  Musculoskeletal: He exhibits no edema or tenderness.  Neurological: He is alert and oriented to person, place, and time.  Psychiatric: Mood, memory, affect and judgment normal.  Lab Results  Component Value Date   WBC 9.0 11/16/2015   HGB 16.4 10/21/2013   HCT 46.4 11/16/2015   PLT 314 11/16/2015   GLUCOSE 80 11/16/2015   CHOL 205* 10/21/2013   TRIG 150 10/21/2013   HDL 54 10/21/2013   LDLCALC 121 10/21/2013   TSH 0.760 11/16/2015   PSA 0.8 10/21/2013    CMP     Component Value Date/Time   NA 140 11/16/2015 1257   K 4.5 11/16/2015 1257   CL 102 11/16/2015 1257   CO2 20 11/16/2015  1257   GLUCOSE 80 11/16/2015 1257   BUN 17 11/16/2015 1257   CREATININE 0.87 11/16/2015 1257   CREATININE 0.9 10/21/2013   CALCIUM 9.7 11/16/2015 1257   PROT 7.4 11/16/2015 1257   ALBUMIN 4.7 11/16/2015 1257   AST 31 11/16/2015 1257   ALT 19 11/16/2015 1257   ALKPHOS 79 11/16/2015 1257   BILITOT <0.2 11/16/2015 1257   GFRNONAA 91 11/16/2015 1257   GFRAA 105 11/16/2015 1257    Assessment and Plan :  1. Mild episode of recurrent major depressive disorder (HCC) Better-20 to 30%. Will go ahead and increase Zoloft to 100 mg.  2. Anxiety, generalized Better.  3. Essential hypertension  4. Tobacco abuse Patient is strongly advised to quit.  Patient was seen and examined by Dr. Bosie Closichard L Gilbert and note was scribed by Samara DeistAnastasiya Aleksandrova, RMA.    Julieanne Mansonichard Gilbert MD [facility] 01/19/2016 3:34 PM

## 2016-03-29 ENCOUNTER — Other Ambulatory Visit: Payer: Self-pay | Admitting: Family Medicine

## 2016-03-30 ENCOUNTER — Other Ambulatory Visit: Payer: Self-pay

## 2016-03-30 NOTE — Telephone Encounter (Signed)
[facility]

## 2016-03-31 MED ORDER — TIZANIDINE HCL 4 MG PO CAPS: 4.0000 mg | ORAL_CAPSULE | Freq: Two times a day (BID) | ORAL | 5 refills | Status: AC | PRN

## 2016-05-16 ENCOUNTER — Ambulatory Visit (INDEPENDENT_AMBULATORY_CARE_PROVIDER_SITE_OTHER): Payer: BLUE CROSS/BLUE SHIELD | Admitting: Family Medicine

## 2016-05-16 ENCOUNTER — Encounter: Payer: Self-pay | Admitting: Family Medicine

## 2016-05-16 VITALS — BP 120/70 | HR 92 | Temp 97.8°F | Resp 16 | Wt 128.0 lb

## 2016-05-16 DIAGNOSIS — R109 Unspecified abdominal pain: Secondary | ICD-10-CM | POA: Diagnosis not present

## 2016-05-16 DIAGNOSIS — F330 Major depressive disorder, recurrent, mild: Secondary | ICD-10-CM | POA: Diagnosis not present

## 2016-05-16 DIAGNOSIS — M5030 Other cervical disc degeneration, unspecified cervical region: Secondary | ICD-10-CM | POA: Diagnosis not present

## 2016-05-16 LAB — POCT URINALYSIS DIPSTICK
BILIRUBIN UA: NEGATIVE
Blood, UA: NEGATIVE
GLUCOSE UA: NEGATIVE
KETONES UA: NEGATIVE
Leukocytes, UA: NEGATIVE
Nitrite, UA: NEGATIVE
SPEC GRAV UA: 1.015
Urobilinogen, UA: 0.2
pH, UA: 6.5

## 2016-05-16 MED ORDER — HYDROCODONE-ACETAMINOPHEN 5-325 MG PO TABS: 1.0000 | ORAL_TABLET | ORAL | 0 refills | Status: DC | PRN

## 2016-05-16 MED ORDER — SERTRALINE HCL 100 MG PO TABS: 150.0000 mg | ORAL_TABLET | Freq: Every day | ORAL | 12 refills | Status: AC

## 2016-05-16 NOTE — Progress Notes (Signed)
Patient: Joseph Farrell Male    DOB: August 11, 1950   65 y.o.   MRN: [id number] Visit Date: 05/16/2016  Today's Provider: Megan Mans, MD   Chief Complaint  Patient presents with  . Depression  . Arthritis  . Abdominal Pain   Subjective:    Depression         Chronicity: FU from 01/19/2016. Increased Zoloft at LOV.  The problem has been gradually improving (Pt reports he is about 75% improved) since onset.  Associated symptoms include body aches (arthritis), headaches and indigestion.  Associated symptoms include no fatigue, no helplessness, no hopelessness, not irritable, no decreased interest, no appetite change, no myalgias, not sad and no suicidal ideas.  Past treatments include SSRIs - Selective serotonin reuptake inhibitors.  Compliance with treatment is good.  Previous treatment provided moderate relief. Arthritis Pt needs refill on his hydrocodone today. Reports good compliance with treatment (taking 1-3 tablets per day, PRN), and good sx control.   Abdominal Pain Pt reports this is an intermittent problem that has been occurring for about 2 years. Pt reports states the pain is cramping, and believes this is pain is attributed to gas. Is relieved by [facility] powder and heating pad. States the reflux medications have not made a notable difference, neither has Vicodin. Denies change in stool, denies bloody or black/tarry stools.     No Known Allergies   Current Outpatient Prescriptions:  .  Aspirin-Salicylamide-Caffeine (BC HEADACHE POWDER PO), Take by mouth., Disp: , Rfl:  .  HYDROcodone-acetaminophen (NORCO/VICODIN) 5-325 MG tablet, Take 1-2 tablets by mouth every 4 (four) hours as needed for moderate pain., Disp: 150 tablet, Rfl: 0 .  omeprazole (PRILOSEC) 20 MG capsule, Take 20 mg by mouth daily., Disp: , Rfl:  .  sertraline (ZOLOFT) 100 MG tablet, Take 1 tablet (100 mg total) by mouth daily., Disp: 30 tablet, Rfl: 12 .  tiZANidine (ZANAFLEX) 4 MG capsule, Take 1  capsule (4 mg total) by mouth 2 (two) times daily as needed., Disp: 60 capsule, Rfl: 5  Review of Systems  Constitutional: Negative for appetite change and fatigue.  Cardiovascular: Negative for chest pain, palpitations and leg swelling.  Gastrointestinal: Positive for abdominal pain. Negative for blood in stool, constipation and diarrhea.  Musculoskeletal: Positive for arthralgias, back pain and neck pain. Negative for myalgias.  Neurological: Positive for headaches.  Psychiatric/Behavioral: Positive for depression. Negative for suicidal ideas.    Social History  Substance Use Topics  . Smoking status: Current Every Day Smoker    Packs/day: 1.50  . Smokeless tobacco: Never Used  . Alcohol use No   Objective:   BP 120/70 (BP Location: Left Arm, Patient Position: Sitting, Cuff Size: Normal)   Pulse 92   Temp 97.8 F (36.6 C) (Oral)   Resp 16   Wt 128 lb (58.1 kg)   BMI 18.37 kg/m   Physical Exam  Constitutional: He is oriented to person, place, and time. He appears well-developed and well-nourished. He is not irritable.  Cachectic white male in no acute distress.  HENT:  Head: Normocephalic and atraumatic.  Right Ear: External ear normal.  Left Ear: External ear normal.  Nose: Nose normal.  Eyes: Conjunctivae are normal. No scleral icterus.  Neck: No thyromegaly present.  Cardiovascular: Normal rate, regular rhythm and normal heart sounds.   Pulmonary/Chest: Effort normal and breath sounds normal.  Abdominal: Soft.  Lymphadenopathy:    He has no cervical adenopathy.  Neurological: He is alert and oriented  to person, place, and time. No cranial nerve deficit. He exhibits normal muscle tone.  Skin: Skin is warm and dry.  Yellow staining from nicotine/cigarettes on his fingers and his mustache  Psychiatric: He has a normal mood and affect. His behavior is normal. Judgment and thought content normal.        Assessment & Plan:     1. Mild episode of recurrent major  depressive disorder (HCC) Markedly improved on Zoloft. Would recommend he remain on this rest of his life. He may have some baseline dysthymia. - sertraline (ZOLOFT) 100 MG tablet; Take 1.5 tablets (150 mg total) by mouth daily.  Dispense: 45 tablet; Refill: 12 I have again recommended patient [place] I hope he will follow through and do so. 2. Flank pain  - POCT urinalysis dipstick  3. Degeneration of intervertebral disc of cervical region I do not like twice a day of chronic narcotics but patient states he cannot function without these. He says he does not  exceed the prescribed limit. - HYDROcodone-acetaminophen (NORCO/VICODIN) 5-325 MG tablet; Take 1-2 tablets by mouth every 4 (four) hours as needed for moderate pain.  Dispense: 150 tablet; Refill: 0 4. Tobacco abuse Patient strongly advised to quit smoking. We'll probably get spirometry on the next visit. 5. Cluster headaches These of improved. In the past several fairly well controlled with lithium      Patient seen and examined by Julieanne Mansonichard Valon Glasscock, MD, and note scribed by Allene DillonEmily Drozdowski, CMA. I have done the exam and reviewed the above chart and it is accurate to the best of my knowledge.  Maansi Wike Wendelyn BreslowGilbert Jr, MD  [facility]

## 2016-05-19 ENCOUNTER — Ambulatory Visit: Payer: BLUE CROSS/BLUE SHIELD | Admitting: Family Medicine

## 2016-07-04 ENCOUNTER — Other Ambulatory Visit: Payer: Self-pay | Admitting: Family Medicine

## 2016-07-04 NOTE — Telephone Encounter (Signed)
Please advise. Thanks.  

## 2016-07-04 NOTE — Telephone Encounter (Signed)
Pt needs to talk with Dr. Sullivan LoneGilbert regarding his pain meds.  He recently had a hospitalization in [place].  He had a GI bleed and during  The stay they suggested that he change his pain medication.  [place] eventually start seeing a primary there.  In the meantime he will need a refill on his pain  Mediation.  Please advise to what we can do.   His call back is [phone]  Thank steri

## 2016-07-05 NOTE — Telephone Encounter (Signed)
Can try Gabapentin for his ongoing pain--it might help. Try 100mg  q hs--#30/11RF

## 2016-07-06 MED ORDER — GABAPENTIN 100 MG PO CAPS: 100.0000 mg | ORAL_CAPSULE | Freq: Every day | ORAL | 11 refills | Status: DC

## 2016-07-06 NOTE — Telephone Encounter (Signed)
Patient is willing to try Gabapentin. RX sent to pharmacy.

## 2016-07-06 NOTE — Telephone Encounter (Signed)
LMTCB

## 2016-10-03 ENCOUNTER — Ambulatory Visit (INDEPENDENT_AMBULATORY_CARE_PROVIDER_SITE_OTHER): Payer: Medicare Other | Admitting: Family Medicine

## 2016-10-03 ENCOUNTER — Encounter: Payer: Self-pay | Admitting: Family Medicine

## 2016-10-03 VITALS — BP 138/84 | HR 100 | Temp 98.4°F | Resp 16 | Wt 130.0 lb

## 2016-10-03 DIAGNOSIS — F17200 Nicotine dependence, unspecified, uncomplicated: Secondary | ICD-10-CM

## 2016-10-03 DIAGNOSIS — I10 Essential (primary) hypertension: Secondary | ICD-10-CM

## 2016-10-03 DIAGNOSIS — M5030 Other cervical disc degeneration, unspecified cervical region: Secondary | ICD-10-CM | POA: Diagnosis not present

## 2016-10-03 DIAGNOSIS — F411 Generalized anxiety disorder: Secondary | ICD-10-CM | POA: Diagnosis not present

## 2016-10-03 DIAGNOSIS — Z23 Encounter for immunization: Secondary | ICD-10-CM | POA: Diagnosis not present

## 2016-10-03 DIAGNOSIS — K270 Acute peptic ulcer, site unspecified, with hemorrhage: Secondary | ICD-10-CM | POA: Diagnosis not present

## 2016-10-03 DIAGNOSIS — E7800 Pure hypercholesterolemia, unspecified: Secondary | ICD-10-CM

## 2016-10-03 DIAGNOSIS — F330 Major depressive disorder, recurrent, mild: Secondary | ICD-10-CM | POA: Diagnosis not present

## 2016-10-03 MED ORDER — BUPROPION HCL ER (XL) 150 MG PO TB24: 150.0000 mg | ORAL_TABLET | Freq: Every day | ORAL | 12 refills | Status: AC

## 2016-10-03 MED ORDER — HYDROCODONE-ACETAMINOPHEN 5-325 MG PO TABS: 1.0000 | ORAL_TABLET | ORAL | 0 refills | Status: DC | PRN

## 2016-10-03 MED ORDER — HYDROCODONE-ACETAMINOPHEN 5-325 MG PO TABS: 1.0000 | ORAL_TABLET | ORAL | 0 refills | Status: AC | PRN

## 2016-10-03 NOTE — Progress Notes (Signed)
Joseph Farrell  MRN: [id number] DOB: 29-Jan-1951  Subjective:  HPI  Patient is here for follow up from October visit. Patient needs refill on Hydrocodone. He has stopped taking BC powders. He did go to the hospital at [facility] city in November 2017 due to severe abdominal pain. He had endoscopy done and was found to have a large duodenum ulcer that was bleeding. He was transferred to [facility] to have a specialist to operate to stop the bleeding but by the time he got there bleeding has stopped on its own. RBC count improved and he has been doing good since then. Weight was around 107 lbs when he left the hospital then and is up to 130 lbs now. Wife is in failing health and essentially the patient has to wait on her hand and foot. We discussed narcotic use and he states he is using 1-2  a day on average.  BP Readings from Last 3 Encounters:  10/03/16 138/84  05/16/16 120/70  01/19/16 (!) 144/72   He is taking Sertraline 150 mg. He feels better since been on medication but not great, like does not have as much interest with his music. Depression screen PHQ 2/9 10/03/2016  Decreased Interest 3  Down, Depressed, Hopeless 1  PHQ - 2 Score 4  Altered sleeping 2  Tired, decreased energy 1  Change in appetite 1  Feeling bad or failure about yourself  0  Trouble concentrating 1  Moving slowly or fidgety/restless 0  Suicidal thoughts 1  PHQ-9 Score 10    Patient Active Problem List   Diagnosis Date Noted  . Allergic rhinitis 05/18/2015  . Disorder of shoulder 05/18/2015  . Chronic cluster headache 05/18/2015  . Insomnia, persistent 05/18/2015  . Cluster headache syndrome 05/18/2015  . Degeneration of intervertebral disc of cervical region 05/18/2015  . Anxiety, generalized 05/18/2015  . Acid reflux 05/18/2015  . BP (high blood pressure) 05/18/2015  . Mild episode of recurrent major depressive disorder (HCC) 05/18/2015  . Pure hypercholesterolemia 05/18/2015  . Compulsive tobacco  user syndrome 05/18/2015  . Avitaminosis D 05/18/2015  . Decreased body weight 05/18/2015    Past Medical History:  Diagnosis Date  . Depression   . GERD (gastroesophageal reflux disease)   . Hyperlipidemia   . Hypertension     Social History   Social History  . Marital status: Married    Spouse name: N/A  . Number of children: N/A  . Years of education: N/A   Occupational History  . Not on file.   Social History Main Topics  . Smoking status: Current Every Day Smoker    Packs/day: 1.50  . Smokeless tobacco: Never Used  . Alcohol use No  . Drug use: No  . Sexual activity: Not on file   Other Topics Concern  . Not on file   Social History Narrative  . No narrative on file    Outpatient Encounter Prescriptions as of 10/03/2016  Medication Sig  . HYDROcodone-acetaminophen (NORCO/VICODIN) 5-325 MG tablet Take 1-2 tablets by mouth every 4 (four) hours as needed for moderate pain.  Marland Kitchen omeprazole (PRILOSEC) 20 MG capsule Take 20 mg by mouth daily.  . sertraline (ZOLOFT) 100 MG tablet Take 1.5 tablets (150 mg total) by mouth daily.  Marland Kitchen tiZANidine (ZANAFLEX) 4 MG capsule Take 1 capsule (4 mg total) by mouth 2 (two) times daily as needed. (Patient not taking: Reported on 10/03/2016)  . [DISCONTINUED] Aspirin-Salicylamide-Caffeine (BC HEADACHE POWDER PO) Take by mouth.  . [DISCONTINUED] gabapentin (  NEURONTIN) 100 MG capsule Take 1 capsule (100 mg total) by mouth at bedtime.   No facility-administered encounter medications on file as of 10/03/2016.     No Known Allergies  Review of Systems  Constitutional: Negative.   Eyes: Negative.   Respiratory: Negative.   Cardiovascular: Negative.   Gastrointestinal:       GI symptoms much improved .  Musculoskeletal: Positive for back pain and joint pain.  Skin: Negative.   Neurological: Negative.   Endo/Heme/Allergies: Negative.   Psychiatric/Behavioral: Positive for depression. The patient is nervous/anxious and has insomnia (off  and on).        Loss of interest.    Objective:  BP 138/84   Pulse 100   Temp 98.4 F (36.9 C)   Resp 16   Wt 130 lb (59 kg)   BMI 18.65 kg/m   Physical Exam  Constitutional: He is oriented to person, place, and time and well-developed, well-nourished, and in no distress.  Cachectic white male in no acute distress.  HENT:  Head: Normocephalic and atraumatic.  Right Ear: External ear normal.  Left Ear: External ear normal.  Nose: Nose normal.  Eyes: Conjunctivae are normal. No scleral icterus.  Neck: No thyromegaly present.  Cardiovascular: Normal rate, regular rhythm and normal heart sounds.   Pulmonary/Chest: Effort normal and breath sounds normal.  Abdominal: Soft. There is no tenderness.  Neurological: He is alert and oriented to person, place, and time. Gait normal. GCS score is 15.  Skin: Skin is warm and dry.  Psychiatric: Mood, memory, affect and judgment normal.    Assessment and Plan :  1. Mild episode of recurrent major depressive disorder (HCC) Add Wellbutrin Xl 150 mg daily. Continue Sertraline 150 mg Patient [place] 220 miles each way to see me. 2. Anxiety, generalized  3. Essential hypertension  4. Need for pneumococcal vaccine Prevnar 13 administered.   5. Degeneration of intervertebral disc of cervical region Refills x 3. Patient aware he will need to stop the narcotic. I do not think he really needs it anymore. He also was using it for cluster headaches which have improved over the past 5 years. - HYDROcodone-acetaminophen (NORCO/VICODIN) 5-325 MG tablet; Take 1-2 tablets by mouth every 4 (four) hours as needed for moderate pain.  Dispense: 150 tablet; Refill: 0 - HYDROcodone-acetaminophen (NORCO/VICODIN) 5-325 MG tablet; Take 1-2 tablets by mouth every 4 (four) hours as needed for moderate pain.  Dispense: 150 tablet; Refill: 0  6. Severe bleeding ulcer of esophagus, stomach  or duodenum Patient was seen at the hospital in [place] for bleeding duodenum ulcer per patient. We do not have records from the hospital. This has resolved at this time. GI bleed almost certainly from overuse of BC powder.  I have done the exam and reviewed the chart and it is accurate to the best of my knowledge. [employer]  technology has been used and  any errors in dictation or transcription are unintentional. Julieanne Mansonichard Gilbert M.D. [facility]  HPI, Exam and A&P transcribed under direction and in the presence of Julieanne Mansonichard Gilbert, MD.

## 2016-10-05 ENCOUNTER — Telehealth: Payer: Self-pay

## 2016-10-05 NOTE — Telephone Encounter (Signed)
Fax came for PA on Hydrocodone/APAP-this was denied-answered no to the question if patient has been checked/screened on the [place] substance abuse website-advised her per DR Sullivan Lonegilbert no this has not been done, has not had suspicion to have him checked. Insurance will also notify patient that this has been denied.-aa

## 2016-10-18 LAB — CBC AND DIFFERENTIAL
HEMATOCRIT: 38 % — AB (ref 41–53)
HEMOGLOBIN: 11.7 g/dL — AB (ref 13.5–17.5)
Neutrophils Absolute: 7 /uL
Platelets: 398 10*3/uL (ref 150–399)
WBC: 9.5 10^3/mL

## 2016-10-18 LAB — LIPID PANEL
CHOLESTEROL: 219 mg/dL — AB (ref 0–200)
HDL: 71 mg/dL — AB (ref 35–70)
LDL Cholesterol: 118 mg/dL
LDl/HDL Ratio: 1.7
TRIGLYCERIDES: 151 mg/dL (ref 40–160)

## 2016-10-18 LAB — HEPATIC FUNCTION PANEL
ALT: 18 U/L (ref 10–40)
AST: 22 U/L (ref 14–40)
Alkaline Phosphatase: 81 U/L (ref 25–125)
Bilirubin, Total: 0.2 mg/dL

## 2016-10-18 LAB — TSH: TSH: 0.76 u[IU]/mL (ref 0.41–5.90)

## 2016-10-18 LAB — BASIC METABOLIC PANEL
BUN: 16 mg/dL (ref 4–21)
Creatinine: 0.9 mg/dL (ref 0.6–1.3)
Glucose: 140 mg/dL
POTASSIUM: 4.9 mmol/L (ref 3.4–5.3)
SODIUM: 140 mmol/L (ref 137–147)

## 2016-10-27 ENCOUNTER — Telehealth: Payer: Self-pay | Admitting: Family Medicine

## 2016-10-27 NOTE — Telephone Encounter (Signed)
Not in computer.

## 2016-10-27 NOTE — Telephone Encounter (Signed)
Pt will get copy of labs and send them to us.

## 2016-10-27 NOTE — Telephone Encounter (Signed)
Pt stated that he had his labs done a few weeks ago [facility] [place] and wanted to know if the lab results were in. Please advise. Thanks TNP

## 2016-11-07 ENCOUNTER — Encounter: Payer: Self-pay | Admitting: Family Medicine

## 2016-11-16 ENCOUNTER — Encounter: Payer: Self-pay | Admitting: Family Medicine

## 2017-04-02 ENCOUNTER — Other Ambulatory Visit: Payer: Self-pay | Admitting: Family Medicine

## 2020-04-01 DEATH — deceased
